# Patient Record
Sex: Male | Born: 1990 | Race: Black or African American | Hispanic: No | Marital: Single | Smoking: Never smoker
Health system: Southern US, Community
[De-identification: ages and names within clinical notes are randomized; demographics above are authoritative.]

---
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
## 2009-06-25 ENCOUNTER — Emergency Department: Payer: Self-pay | Admitting: Emergency Medicine

## 2009-07-05 ENCOUNTER — Emergency Department: Payer: Self-pay | Admitting: Emergency Medicine

## 2009-07-13 ENCOUNTER — Emergency Department: Payer: Self-pay | Admitting: Emergency Medicine
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
## 2011-01-07 ENCOUNTER — Emergency Department: Payer: Self-pay | Admitting: Emergency Medicine

## 2011-01-10 ENCOUNTER — Emergency Department: Payer: Self-pay | Admitting: Internal Medicine

## 2011-08-25 ENCOUNTER — Emergency Department: Payer: Self-pay | Admitting: Emergency Medicine

## 2011-11-18 ENCOUNTER — Emergency Department: Payer: Self-pay | Admitting: Emergency Medicine

## 2011-11-20 ENCOUNTER — Emergency Department: Payer: Self-pay | Admitting: Unknown Physician Specialty

## 2011-11-25 ENCOUNTER — Emergency Department: Payer: Self-pay | Admitting: Internal Medicine

## 2011-11-27 ENCOUNTER — Emergency Department: Payer: Self-pay | Admitting: *Deleted

## 2012-03-01 ENCOUNTER — Emergency Department: Payer: Self-pay | Admitting: Emergency Medicine

## 2012-03-04 ENCOUNTER — Emergency Department: Payer: Self-pay | Admitting: *Deleted

## 2012-12-21 ENCOUNTER — Emergency Department: Payer: Self-pay | Admitting: Emergency Medicine

## 2012-12-21 LAB — CBC
HCT: 41.1 % (ref 40.0–52.0)
HGB: 13.6 g/dL (ref 13.0–18.0)
MCH: 30.9 pg (ref 26.0–34.0)
MCV: 94 fL (ref 80–100)
RDW: 13.7 % (ref 11.5–14.5)
WBC: 14.5 10*3/uL — ABNORMAL HIGH (ref 3.8–10.6)

## 2012-12-24 ENCOUNTER — Emergency Department: Payer: Self-pay | Admitting: Internal Medicine

## 2012-12-25 ENCOUNTER — Emergency Department: Payer: Self-pay | Admitting: Emergency Medicine

## 2013-01-23 ENCOUNTER — Emergency Department: Payer: Self-pay | Admitting: Emergency Medicine

## 2013-03-27 ENCOUNTER — Emergency Department: Payer: Self-pay | Admitting: Emergency Medicine

## 2013-03-28 ENCOUNTER — Emergency Department: Payer: Self-pay | Admitting: Emergency Medicine

## 2020-06-05 ENCOUNTER — Other Ambulatory Visit: Payer: Self-pay

## 2020-06-05 DIAGNOSIS — Z20822 Contact with and (suspected) exposure to covid-19: Secondary | ICD-10-CM

## 2020-06-07 LAB — SARS-COV-2, NAA 2 DAY TAT

## 2020-06-07 LAB — NOVEL CORONAVIRUS, NAA: SARS-CoV-2, NAA: NOT DETECTED

## 2021-08-18 ENCOUNTER — Emergency Department
Admission: EM | Admit: 2021-08-18 | Discharge: 2021-08-18 | Disposition: A | Discharge status: Home / Self Care | Payer: Self-pay | Attending: Emergency Medicine | Admitting: Emergency Medicine

## 2021-08-18 ENCOUNTER — Emergency Department: Payer: Self-pay

## 2021-08-18 ENCOUNTER — Other Ambulatory Visit: Payer: Self-pay

## 2021-08-18 DIAGNOSIS — W208XXA Other cause of strike by thrown, projected or falling object, initial encounter: Secondary | ICD-10-CM | POA: Insufficient documentation

## 2021-08-18 DIAGNOSIS — S82832A Other fracture of upper and lower end of left fibula, initial encounter for closed fracture: Secondary | ICD-10-CM | POA: Insufficient documentation
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
## 2021-08-18 NOTE — Discharge Instructions (Addendum)
Follow-up with the orthopedist in about a week.  Keep the splint on at all times until then and do not bear weight on the left leg.  Continue to use the crutches.  Return to the ER for new or worsening pain, swelling, numbness, or any other new or worsening symptoms that concern you.

## 2022-01-22 IMAGING — CR DG ANKLE COMPLETE 3+V*L*
1 series · 3 of 3 positions shown · non-contrast
Comparison: Foot exam of the same date.

CLINICAL DATA: Increased pain and swelling reportedly for 3 months.

EXAM:
LEFT ANKLE COMPLETE - 3+ VIEW

[Series 1: dg ankle complete left · 0.14mm/px · 3 of 3 slices shown]
[im 1/3]
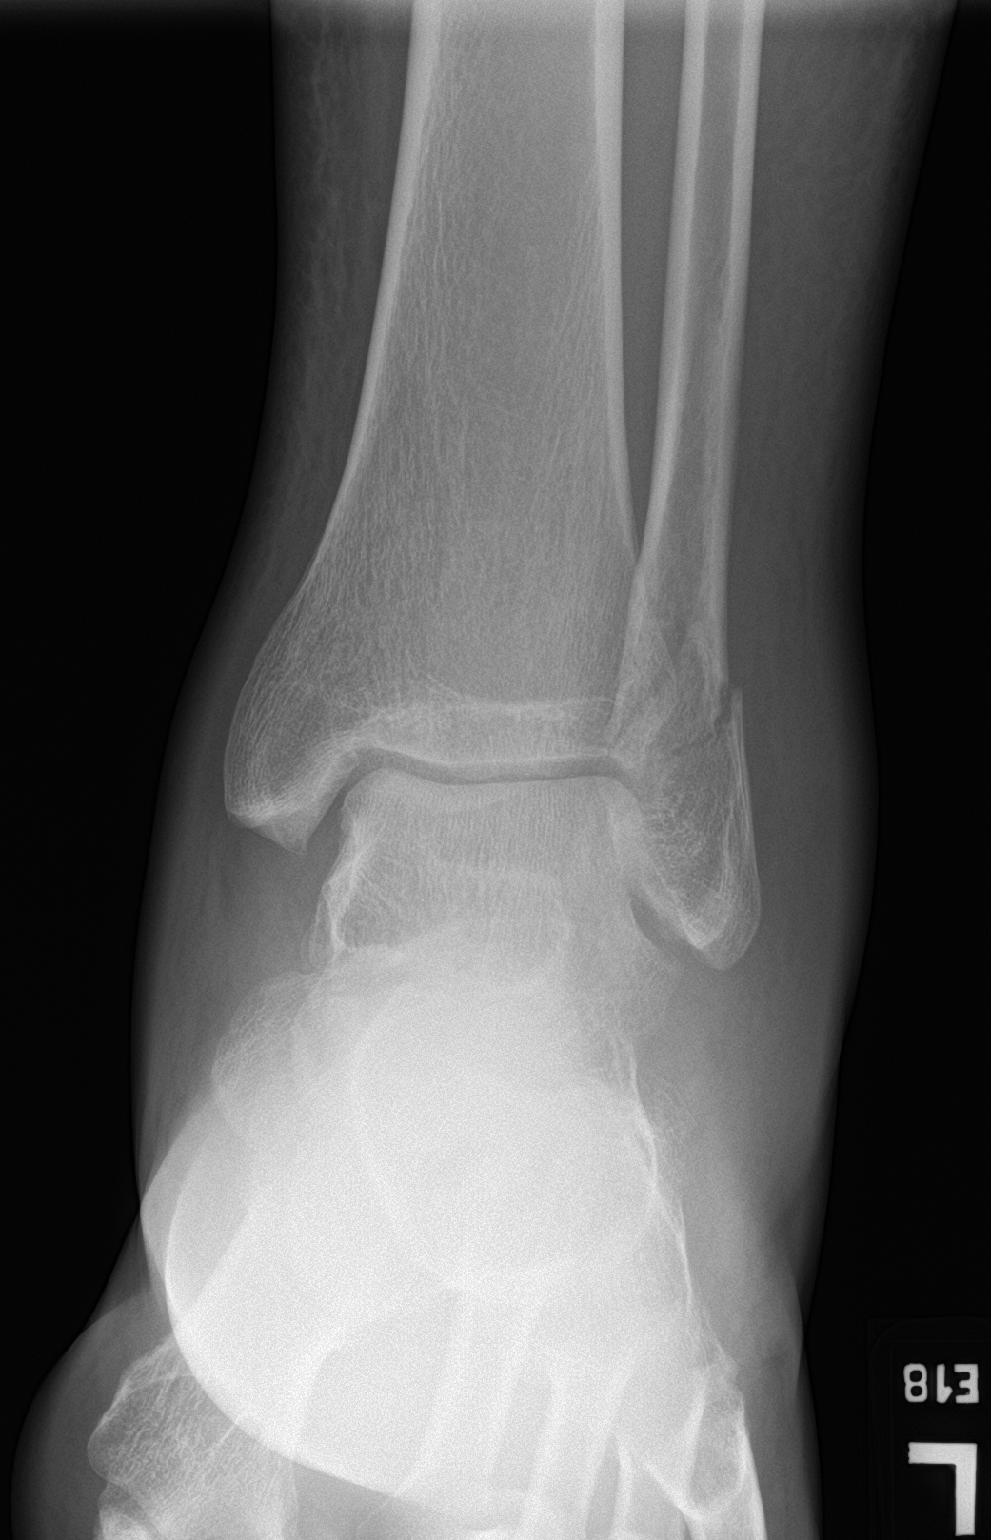
[im 2/3]
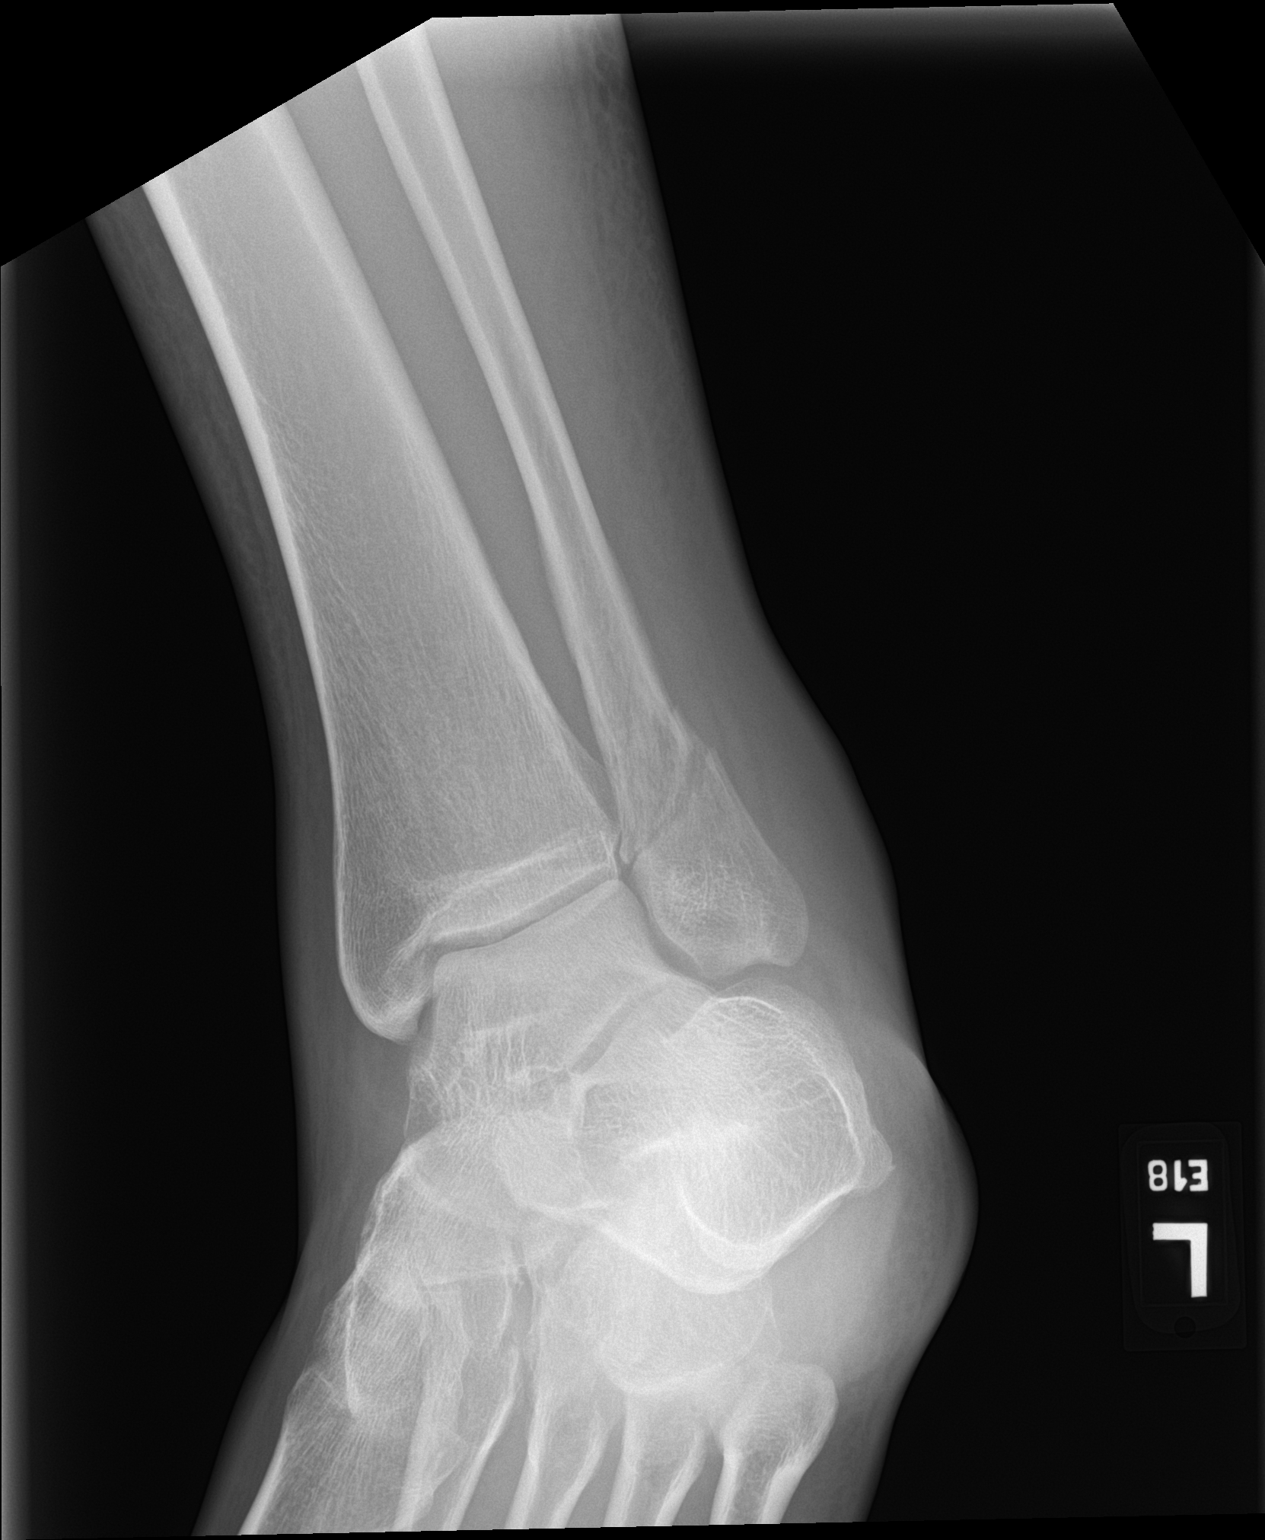
[im 3/3]
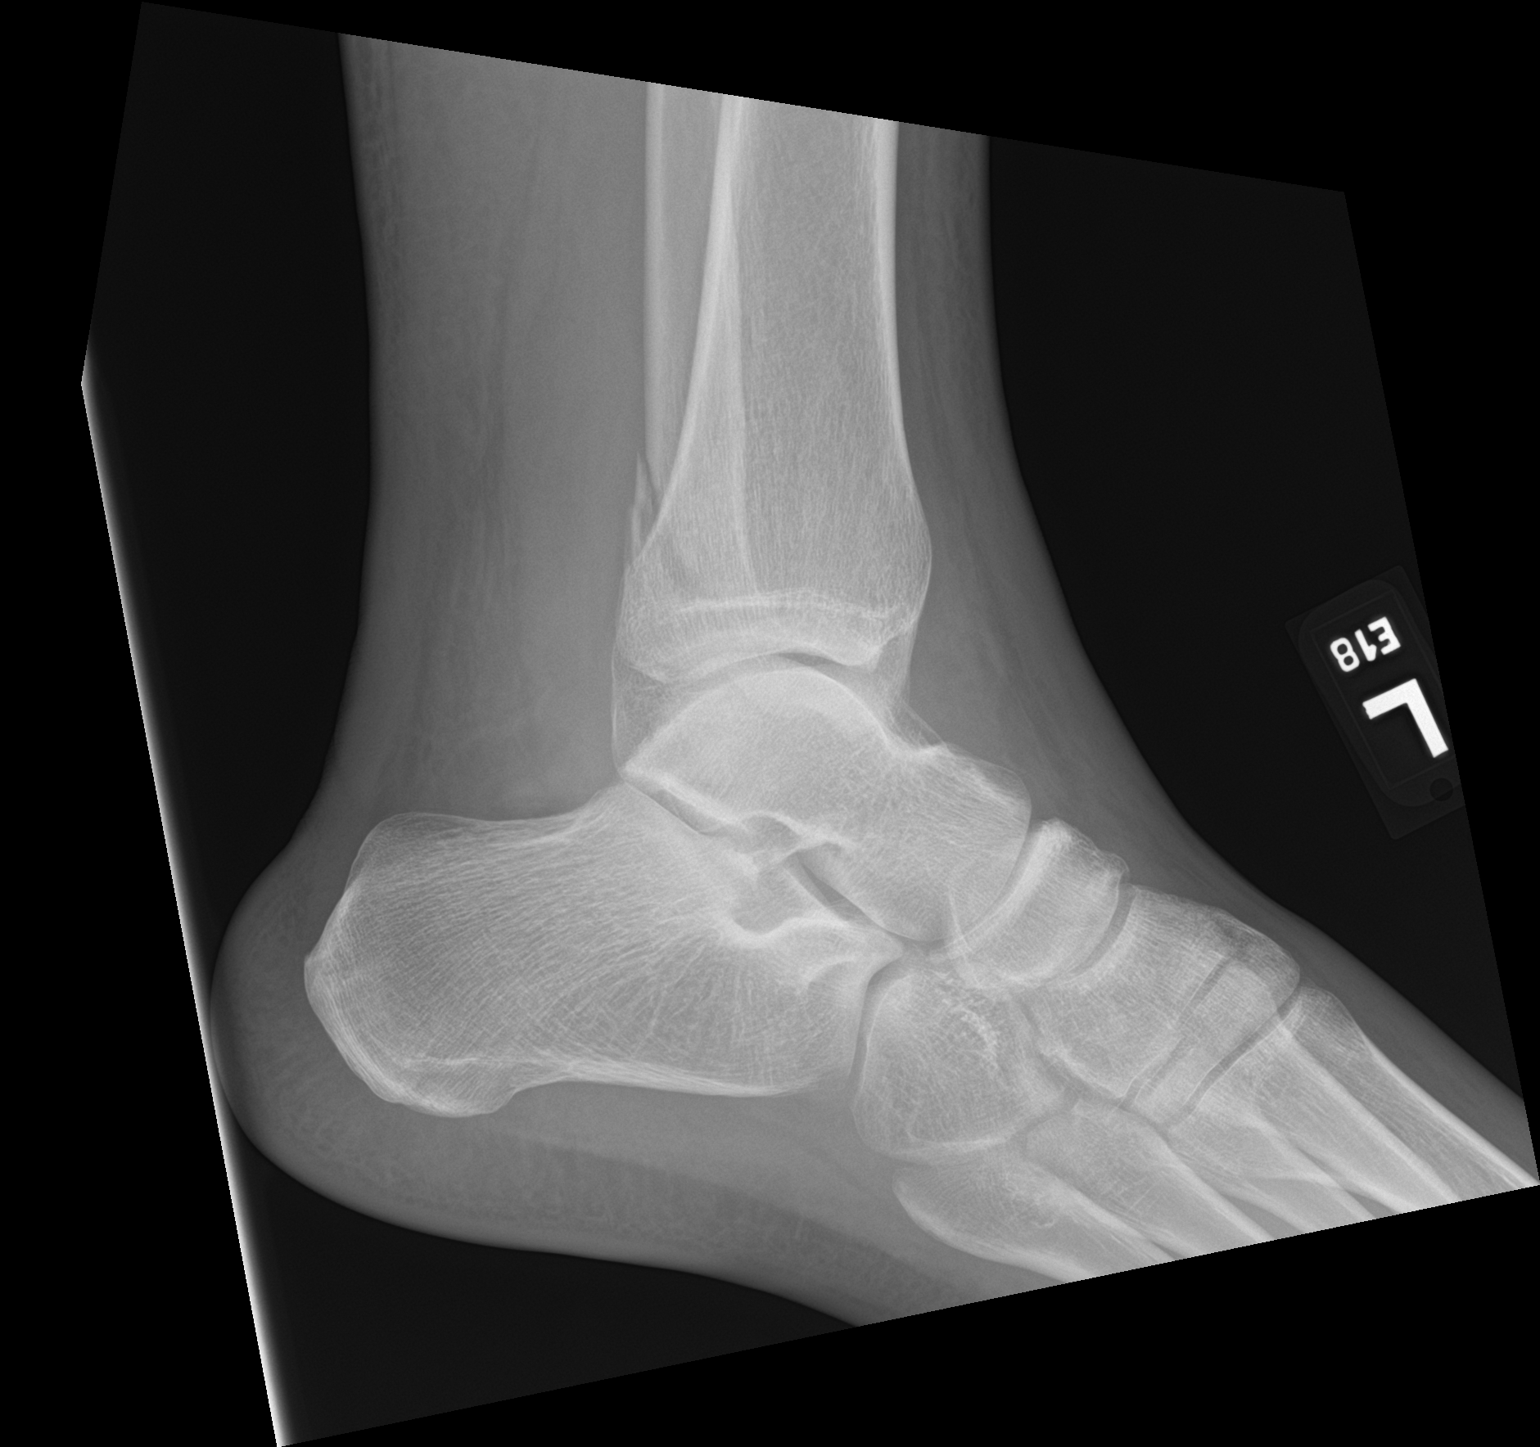

[3 of 3 positions shown; findings below may reference images not displayed]

FINDINGS: Mildly comminuted and displaced fracture of the lateral malleolus.
Extensive soft tissue swelling. No signs of callus formation. Ankle
mortise is preserved. Fracture is oblique on lateral view also with
minimal displacement on lateral view. No sign of dislocation.
IMPRESSION: Mildly comminuted and displaced oblique fracture of the lateral
malleolus with extensive soft tissue swelling. Radiographic
appearance would be more compatible with acute injury as there is no
visible callus formation.

Extensive soft tissue swelling mainly over the lateral malleolus.

## 2023-10-09 ENCOUNTER — Emergency Department
Admission: EM | Admit: 2023-10-09 | Discharge: 2023-10-09 | Disposition: A | Discharge status: Home / Self Care | Payer: Medicaid Other | Attending: Emergency Medicine | Admitting: Emergency Medicine

## 2023-10-09 ENCOUNTER — Other Ambulatory Visit: Payer: Self-pay

## 2023-10-09 DIAGNOSIS — L089 Local infection of the skin and subcutaneous tissue, unspecified: Secondary | ICD-10-CM

## 2023-10-09 DIAGNOSIS — L0211 Cutaneous abscess of neck: Secondary | ICD-10-CM | POA: Insufficient documentation

## 2023-10-09 MED ORDER — DOXYCYCLINE HYCLATE 100 MG PO TABS: 100.0000 mg | ORAL_TABLET | Freq: Two times a day (BID) | ORAL | 0 refills | Status: DC

## 2023-10-09 MED ORDER — DOXYCYCLINE HYCLATE 100 MG PO TABS
100.0000 mg | ORAL_TABLET | Freq: Once | ORAL | Status: AC
  Administered 2023-10-09: 100 mg via ORAL
  Filled 2023-10-09: qty 1

## 2023-10-09 MED ORDER — LIDOCAINE HCL (PF) 1 % IJ SOLN
5.0000 mL | Freq: Once | INTRAMUSCULAR | Status: AC
  Administered 2023-10-09: 5 mL
  Filled 2023-10-09: qty 5

## 2023-10-09 NOTE — Discharge Instructions (Signed)
You have had your skin abscess drained in the ED.  Keep the wound clean, dry, and covered as directed.  Apply warm compresses over the dressing to help promote healing.  Take the antibiotic daily until all pills are completed.  You may remove the packing in 3 days or return to the ED for wound check and packing removal.

## 2023-10-09 NOTE — ED Notes (Signed)
See triage note  Presents with poss abscess area to back of neck
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
## 2023-10-09 NOTE — ED Triage Notes (Signed)
Pt c/o a painful cyst on the back of neck; states it's been there for about a week.

## 2024-06-09 ENCOUNTER — Emergency Department

## 2024-06-09 ENCOUNTER — Emergency Department
Admission: EM | Admit: 2024-06-09 | Discharge: 2024-06-09 | Disposition: A | Discharge status: Home / Self Care | Attending: Emergency Medicine | Admitting: Emergency Medicine

## 2024-06-09 ENCOUNTER — Other Ambulatory Visit: Payer: Self-pay

## 2024-06-09 DIAGNOSIS — M542 Cervicalgia: Secondary | ICD-10-CM | POA: Insufficient documentation

## 2024-06-09 DIAGNOSIS — M25512 Pain in left shoulder: Secondary | ICD-10-CM | POA: Insufficient documentation

## 2024-06-09 DIAGNOSIS — M7918 Myalgia, other site: Secondary | ICD-10-CM

## 2024-06-09 MED ORDER — KETOROLAC TROMETHAMINE 15 MG/ML IJ SOLN
15.0000 mg | Freq: Once | INTRAMUSCULAR | Status: AC
  Administered 2024-06-09: 15 mg via INTRAMUSCULAR
  Filled 2024-06-09: qty 1

## 2024-06-09 MED ORDER — LIDOCAINE 5 % EX PTCH
1.0000 | MEDICATED_PATCH | CUTANEOUS | Status: DC
  Administered 2024-06-09: 1 via TRANSDERMAL
  Filled 2024-06-09: qty 1

## 2024-06-09 NOTE — ED Notes (Signed)
 Patient left without discharge instructions.
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
## 2024-06-09 NOTE — Discharge Instructions (Signed)
 Your CT scan and x-ray were normal.  You may continue to take Tylenol/ibuprofen per package instructions to help with your symptoms.  Please return for any new, worsening, or change in symptoms or other concerns.
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
## 2024-06-13 ENCOUNTER — Encounter: Payer: Self-pay | Admitting: Emergency Medicine

## 2024-06-13 ENCOUNTER — Emergency Department

## 2024-06-13 ENCOUNTER — Emergency Department
Admission: EM | Admit: 2024-06-13 | Discharge: 2024-06-13 | Disposition: A | Discharge status: Home / Self Care | Attending: Emergency Medicine | Admitting: Emergency Medicine

## 2024-06-13 ENCOUNTER — Other Ambulatory Visit: Payer: Self-pay

## 2024-06-13 DIAGNOSIS — M7918 Myalgia, other site: Secondary | ICD-10-CM

## 2024-06-13 DIAGNOSIS — M546 Pain in thoracic spine: Secondary | ICD-10-CM | POA: Diagnosis present

## 2024-06-13 DIAGNOSIS — Y92410 Unspecified street and highway as the place of occurrence of the external cause: Secondary | ICD-10-CM | POA: Insufficient documentation

## 2024-06-13 MED ORDER — ACETAMINOPHEN 325 MG PO TABS
650.0000 mg | ORAL_TABLET | Freq: Once | ORAL | Status: AC
  Administered 2024-06-13: 650 mg via ORAL
  Filled 2024-06-13: qty 2

## 2024-06-13 MED ORDER — CYCLOBENZAPRINE HCL 5 MG PO TABS: 5.0000 mg | ORAL_TABLET | Freq: Every day | ORAL | 0 refills | Status: DC

## 2024-06-13 MED ORDER — NAPROXEN 500 MG PO TABS
500.0000 mg | ORAL_TABLET | Freq: Once | ORAL | Status: AC
  Administered 2024-06-13: 500 mg via ORAL
  Filled 2024-06-13: qty 1
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
## 2024-06-13 NOTE — Discharge Instructions (Signed)
 You may take the muscle relaxant to help with your symptoms, but remember that this medication will make you sleepy and you cannot drive, operate heavy machinery, or perform any test that require concentration while taking this medication.  Please return for any new, worsening, or change in symptoms or other concerns.  It was a pleasure caring for you today.

## 2024-07-20 ENCOUNTER — Other Ambulatory Visit: Payer: Self-pay

## 2024-07-20 DIAGNOSIS — G8911 Acute pain due to trauma: Secondary | ICD-10-CM

## 2024-07-20 DIAGNOSIS — M546 Pain in thoracic spine: Secondary | ICD-10-CM

## 2024-07-26 ENCOUNTER — Ambulatory Visit
Admission: RE | Admit: 2024-07-26 | Discharge: 2024-07-26 | Disposition: A | Discharge status: Home / Self Care | Source: Ambulatory Visit

## 2024-07-26 DIAGNOSIS — G8911 Acute pain due to trauma: Secondary | ICD-10-CM

## 2024-07-26 DIAGNOSIS — M546 Pain in thoracic spine: Secondary | ICD-10-CM

## 2024-11-16 ENCOUNTER — Other Ambulatory Visit: Payer: Self-pay

## 2024-11-16 ENCOUNTER — Emergency Department
Admission: EM | Admit: 2024-11-16 | Discharge: 2024-11-16 | Disposition: A | Discharge status: Home / Self Care | Attending: Physician Assistant | Admitting: Physician Assistant

## 2024-11-16 DIAGNOSIS — M25512 Pain in left shoulder: Secondary | ICD-10-CM | POA: Insufficient documentation

## 2024-11-16 DIAGNOSIS — M7918 Myalgia, other site: Secondary | ICD-10-CM

## 2024-11-16 DIAGNOSIS — M542 Cervicalgia: Secondary | ICD-10-CM | POA: Insufficient documentation

## 2024-11-16 DIAGNOSIS — Y92410 Unspecified street and highway as the place of occurrence of the external cause: Secondary | ICD-10-CM | POA: Insufficient documentation

## 2024-11-16 MED ORDER — CYCLOBENZAPRINE HCL 5 MG PO TABS: 5.0000 mg | ORAL_TABLET | Freq: Three times a day (TID) | ORAL | 0 refills | Status: AC | PRN
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
## 2024-11-16 NOTE — ED Notes (Signed)
Patient discharged to home per MD order. Patient in stable condition, and deemed medically cleared by ED provider for discharge. Discharge instructions reviewed with patient/family using "Teach Back"; verbalized understanding of medication education and administration, and information about follow-up care. Denies further concerns. ° °
